# Patient Record
Sex: Female | Born: 1955 | Race: White | Hispanic: No | Marital: Married | Smoking: Former smoker
Health system: Southern US, Community
[De-identification: ages and names within clinical notes are randomized; demographics above are authoritative.]

## PROBLEM LIST (undated history)

## (undated) DIAGNOSIS — I10 Essential (primary) hypertension: Secondary | ICD-10-CM

## (undated) HISTORY — DX: Essential (primary) hypertension: I10

## (undated) HISTORY — PX: DILATION AND CURETTAGE OF UTERUS: SHX78

---

## 1978-12-15 HISTORY — PX: BREAST BIOPSY: SHX20

## 1978-12-15 HISTORY — PX: LAPAROSCOPY ABDOMEN DIAGNOSTIC: PRO50

## 1988-04-15 HISTORY — PX: KNEE ARTHROSCOPY: SUR90

## 1999-05-21 ENCOUNTER — Other Ambulatory Visit: Admission: RE | Admit: 1999-05-21 | Discharge: 1999-05-21 | Payer: Self-pay | Admitting: Gynecology

## 1999-05-25 ENCOUNTER — Encounter (INDEPENDENT_AMBULATORY_CARE_PROVIDER_SITE_OTHER): Payer: Self-pay

## 1999-05-25 ENCOUNTER — Other Ambulatory Visit: Admission: RE | Admit: 1999-05-25 | Discharge: 1999-05-25 | Payer: Self-pay | Admitting: Gynecology

## 2000-06-11 ENCOUNTER — Other Ambulatory Visit: Admission: RE | Admit: 2000-06-11 | Discharge: 2000-06-11 | Payer: Self-pay | Admitting: Gynecology

## 2000-06-26 ENCOUNTER — Encounter (INDEPENDENT_AMBULATORY_CARE_PROVIDER_SITE_OTHER): Payer: Self-pay

## 2000-06-26 ENCOUNTER — Other Ambulatory Visit: Admission: RE | Admit: 2000-06-26 | Discharge: 2000-06-26 | Payer: Self-pay | Admitting: Gynecology

## 2001-05-20 ENCOUNTER — Other Ambulatory Visit: Admission: RE | Admit: 2001-05-20 | Discharge: 2001-05-20 | Payer: Self-pay | Admitting: Gynecology

## 2002-06-14 ENCOUNTER — Other Ambulatory Visit: Admission: RE | Admit: 2002-06-14 | Discharge: 2002-06-14 | Payer: Self-pay | Admitting: Gynecology

## 2003-06-07 ENCOUNTER — Encounter: Admission: RE | Admit: 2003-06-07 | Discharge: 2003-06-07 | Payer: Self-pay | Admitting: Gynecology

## 2003-06-13 ENCOUNTER — Encounter: Admission: RE | Admit: 2003-06-13 | Discharge: 2003-06-13 | Payer: Self-pay | Admitting: Gynecology

## 2003-07-21 ENCOUNTER — Other Ambulatory Visit: Admission: RE | Admit: 2003-07-21 | Discharge: 2003-07-21 | Payer: Self-pay | Admitting: Gynecology

## 2004-06-25 ENCOUNTER — Encounter: Admission: RE | Admit: 2004-06-25 | Discharge: 2004-06-25 | Payer: Self-pay | Admitting: Gynecology

## 2004-07-02 ENCOUNTER — Encounter: Admission: RE | Admit: 2004-07-02 | Discharge: 2004-07-02 | Payer: Self-pay | Admitting: Gynecology

## 2004-08-09 ENCOUNTER — Other Ambulatory Visit: Admission: RE | Admit: 2004-08-09 | Discharge: 2004-08-09 | Payer: Self-pay | Admitting: Gynecology

## 2004-12-27 ENCOUNTER — Encounter: Admission: RE | Admit: 2004-12-27 | Discharge: 2004-12-27 | Payer: Self-pay | Admitting: Gynecology

## 2005-08-15 ENCOUNTER — Other Ambulatory Visit: Admission: RE | Admit: 2005-08-15 | Discharge: 2005-08-15 | Payer: Self-pay | Admitting: Gynecology

## 2006-01-01 ENCOUNTER — Encounter: Admission: RE | Admit: 2006-01-01 | Discharge: 2006-01-01 | Payer: Self-pay | Admitting: Gynecology

## 2006-09-18 ENCOUNTER — Other Ambulatory Visit: Admission: RE | Admit: 2006-09-18 | Discharge: 2006-09-18 | Payer: Self-pay | Admitting: Gynecology

## 2007-01-06 ENCOUNTER — Encounter: Admission: RE | Admit: 2007-01-06 | Discharge: 2007-01-06 | Payer: Self-pay | Admitting: Gynecology

## 2007-05-15 ENCOUNTER — Encounter: Admission: RE | Admit: 2007-05-15 | Discharge: 2007-05-15 | Payer: Self-pay

## 2007-11-03 ENCOUNTER — Other Ambulatory Visit: Admission: RE | Admit: 2007-11-03 | Discharge: 2007-11-03 | Payer: Self-pay | Admitting: Gynecology

## 2008-05-19 ENCOUNTER — Encounter: Admission: RE | Admit: 2008-05-19 | Discharge: 2008-05-19 | Payer: Self-pay | Admitting: Gynecology

## 2010-02-21 ENCOUNTER — Ambulatory Visit: Payer: Self-pay | Admitting: Vascular Surgery

## 2010-08-28 NOTE — Procedures (Signed)
DUPLEX ULTRASOUND OF ABDOMINAL AORTA   INDICATION:  Family history of AAA.   HISTORY:  Diabetes:  No.  Cardiac:  No.  Hypertension:  No.  Smoking:  No.  Connective Tissue Disorder:  Family History:  Mother, father, brother have AAA.  Previous Surgery:  No.   DUPLEX EXAM:         AP (cm)                   TRANSVERSE (cm)  Proximal             1.24 cm                   1.18 cm  Mid                  1.18 cm                   1.24 cm  Distal               1.19 cm                   1.16 cm  Right Iliac  Left Iliac   PREVIOUS:  Date:  AP  TRANSVERSE:   IMPRESSION:  No evidence of abdominal aortic aneurysm was found.   ___________________________________________  Di Kindle. Edilia Bo, M.D.   EM/MEDQ  D:  02/21/2010  T:  02/21/2010  Job:  [id number]

## 2010-08-31 ENCOUNTER — Other Ambulatory Visit: Payer: Self-pay | Admitting: Family Medicine

## 2010-08-31 DIAGNOSIS — M79673 Pain in unspecified foot: Secondary | ICD-10-CM

## 2010-08-31 DIAGNOSIS — M79606 Pain in leg, unspecified: Secondary | ICD-10-CM

## 2010-09-06 ENCOUNTER — Other Ambulatory Visit: Payer: Self-pay | Admitting: Family Medicine

## 2010-09-06 ENCOUNTER — Ambulatory Visit
Admission: RE | Admit: 2010-09-06 | Discharge: 2010-09-06 | Disposition: A | Discharge status: Home / Self Care | Payer: BC Managed Care – PPO | Source: Ambulatory Visit | Attending: Family Medicine | Admitting: Family Medicine

## 2010-09-06 DIAGNOSIS — M79673 Pain in unspecified foot: Secondary | ICD-10-CM

## 2010-09-06 DIAGNOSIS — Z8249 Family history of ischemic heart disease and other diseases of the circulatory system: Secondary | ICD-10-CM

## 2010-09-06 DIAGNOSIS — M79606 Pain in leg, unspecified: Secondary | ICD-10-CM

## 2011-07-30 ENCOUNTER — Encounter: Payer: Self-pay | Admitting: Internal Medicine

## 2011-08-13 ENCOUNTER — Ambulatory Visit (AMBULATORY_SURGERY_CENTER): Payer: BC Managed Care – PPO | Admitting: *Deleted

## 2011-08-13 VITALS — Ht 70.5 in | Wt 158.0 lb

## 2011-08-13 DIAGNOSIS — Z1211 Encounter for screening for malignant neoplasm of colon: Secondary | ICD-10-CM

## 2011-08-13 MED ORDER — PEG-KCL-NACL-NASULF-NA ASC-C 100 G PO SOLR: ORAL | Status: DC

## 2011-08-26 ENCOUNTER — Telehealth: Payer: Self-pay | Admitting: Internal Medicine

## 2011-08-26 NOTE — Telephone Encounter (Signed)
Pt. Had MOVI Prep instead of Suprep and needed instructions on how to take the Movi prep.  Apology given for mixup.

## 2011-08-27 ENCOUNTER — Encounter: Payer: Self-pay | Admitting: Internal Medicine

## 2011-08-27 ENCOUNTER — Ambulatory Visit (AMBULATORY_SURGERY_CENTER): Payer: BC Managed Care – PPO | Admitting: Internal Medicine

## 2011-08-27 VITALS — BP 127/75 | HR 77 | Temp 96.5°F | Resp 16 | Ht 70.5 in | Wt 158.0 lb

## 2011-08-27 DIAGNOSIS — D126 Benign neoplasm of colon, unspecified: Secondary | ICD-10-CM

## 2011-08-27 DIAGNOSIS — Z1211 Encounter for screening for malignant neoplasm of colon: Secondary | ICD-10-CM

## 2011-08-27 DIAGNOSIS — K635 Polyp of colon: Secondary | ICD-10-CM

## 2011-08-27 MED ORDER — SODIUM CHLORIDE 0.9 % IV SOLN: 500.0000 mL | INTRAVENOUS | Status: DC

## 2011-08-27 NOTE — Progress Notes (Signed)
See phone note for 08-26-11- pt had Moviprep instead of Suprep

## 2011-08-27 NOTE — Op Note (Signed)
[facility] [address] [place], [place]  [address]  COLONOSCOPY PROCEDURE REPORT  Ferguson:  Whitney Ferguson, Whitney Ferguson  MR#:  [id number] BIRTHDATE:  1955-07-13, 55 yrs. old  GENDER:  female ENDOSCOPIST:  Carie Caddy. Bryana Froemming, MD REF. BY:  Burnell Blanks, M.D. PROCEDURE DATE:  08/27/2011 PROCEDURE:  Colonoscopy with snare polypectomy ASA CLASS:  Class II INDICATIONS:  Routine Risk Screening, 1st colonoscopy MEDICATIONS:   MAC sedation, administered by CRNA, propofol (Diprivan) 320 mg IV  DESCRIPTION OF PROCEDURE:   After Whitney risks benefits and alternatives of Whitney procedure were thoroughly explained, informed consent was obtained.  Digital rectal exam was performed and revealed moderate external hemorrhoids.   Whitney LB PCF-H180AL [id number] endoscope was introduced through Whitney anus and advanced to Whitney cecum, which was identified by both Whitney appendix and ileocecal valve, without limitations.  Whitney quality of Whitney prep was good, using MoviPrep.  Whitney instrument was then slowly withdrawn as Whitney colon was fully examined. <<PROCEDUREIMAGES>>  FINDINGS:  Mild diverticulosis was found in Whitney right colon.  A 6 mm sessile polyp was found in Whitney cecum. Polyp was snared, then cauterized with monopolar cautery. Retrieval was successful. Retroflexed views in Whitney rectum revealed no abnormalities. Whitney scope was then withdrawn from Whitney cecum and Whitney procedure completed.  COMPLICATIONS:  None ENDOSCOPIC IMPRESSION: 1) Mild diverticulosis in Whitney right colon 2) Sessile polyp in Whitney cecum. Removed and sent to pathology. 3) External hemorrhoids  RECOMMENDATIONS: 1) Hold aspirin, aspirin products, and anti-inflammatory medication for 1 week. 2) Await pathology results 3) High fiber diet. 4) If Whitney polyp removed today is proven to be an adenomatous (pre-cancerous) polyp, you will need a repeat colonoscopy in 5 years. Otherwise you should continue to follow colorectal cancer screening guidelines for "routine  risk" patients with colonoscopy in 10 years. You will receive a letter within 1-2 weeks with Whitney results of your biopsy as well as final recommendations. Please call my office if you have not received a letter after 3 weeks. 5) If external hemorrhoids are a problem, please contact our office and treatment can be prescribed.  Carie Caddy. Rhea Belton, MD  CC:  Whitney Ferguson Burnell Blanks, MD  n. eSIGNEDCarie Caddy. Zadin Lange at 08/27/2011 10:49 AM  Audery Amel, [phone]

## 2011-08-27 NOTE — Patient Instructions (Addendum)
See colonoscopy report for findings and Recommendations  HOLD ASPIRIN, ASPIRIN PRODUCTS AND ANTI-INFLAMMATORY MEDICATION FOR ONE WEEK.  CAN RESUME ASPIRIN PRODUCTS ON 09-03-2011.  YOU HAD AN ENDOSCOPIC PROCEDURE TODAY AT THE [facility]: Refer to the procedure report that was given to you for any specific questions about what was found during the examination.  If the procedure report does not answer your questions, please call your gastroenterologist to clarify.  If you requested that your care partner not be given the details of your procedure findings, then the procedure report has been included in a sealed envelope for you to review at your convenience later.  YOU SHOULD EXPECT: Some feelings of bloating in the abdomen. Passage of more gas than usual.  Walking can help get rid of the air that was put into your GI tract during the procedure and reduce the bloating. If you had a lower endoscopy (such as a colonoscopy or flexible sigmoidoscopy) you may notice spotting of blood in your stool or on the toilet paper. If you underwent a bowel prep for your procedure, then you may not have a normal bowel movement for a few days.  DIET: Your first meal following the procedure should be a light meal and then it is ok to progress to your normal diet.  A half-sandwich or bowl of soup is an example of a good first meal.  Heavy or fried foods are harder to digest and may make you feel nauseous or bloated.  Likewise meals heavy in dairy and vegetables can cause extra gas to form and this can also increase the bloating.  Drink plenty of fluids but you should avoid alcoholic beverages for 24 hours.  ACTIVITY: Your care partner should take you home directly after the procedure.  You should plan to take it easy, moving slowly for the rest of the day.  You can resume normal activity the day after the procedure however you should NOT DRIVE or use heavy machinery for 24 hours (because of the sedation medicines  used during the test).    SYMPTOMS TO REPORT IMMEDIATELY: A gastroenterologist can be reached at any hour.  During normal business hours, 8:30 AM to 5:00 PM Monday through Friday, call [phone].  After hours and on weekends, please call the GI answering service at [phone] who will take a message and have the physician on call contact you.   Following lower endoscopy (colonoscopy or flexible sigmoidoscopy):  Excessive amounts of blood in the stool  Significant tenderness or worsening of abdominal pains  Swelling of the abdomen that is new, acute  Fever of 100F or higher  Following upper endoscopy (EGD)  Vomiting of blood or coffee ground material  New chest pain or pain under the shoulder blades  Painful or persistently difficult swallowing  New shortness of breath  Fever of 100F or higher  Black, tarry-looking stools  FOLLOW UP: If any biopsies were taken you will be contacted by phone or by letter within the next 1-3 weeks.  Call your gastroenterologist if you have not heard about the biopsies in 3 weeks.  Our staff will call the home number listed on your records the next business day following your procedure to check on you and address any questions or concerns that you may have at that time regarding the information given to you following your procedure. This is a courtesy call and so if there is no answer at the home number and we have not heard from you  through the emergency physician on call, we will assume that you have returned to your regular daily activities without incident.  SIGNATURES/CONFIDENTIALITY: You and/or your care partner have signed paperwork which will be entered into your electronic medical record.  These signatures attest to the fact that that the information above on your After Visit Summary has been reviewed and is understood.  Full responsibility of the confidentiality of this discharge information lies with you and/or your care-partner.   Please  follow all discharge instructions given to you by the recovery room nurse. If you have any questions or problems after discharge please call one of the numbers listed above. You will receive a phone call in the am to see how you are doing and answer any questions you may have. Thank you for choosing [facility] for your health care needs.

## 2011-08-27 NOTE — Progress Notes (Signed)
Patient did not experience any of the following events: a burn prior to discharge; a fall within the facility; wrong site/side/patient/procedure/implant event; or a hospital transfer or hospital admission upon discharge from the facility. (G8907) Patient did not have preoperative order for IV antibiotic SSI prophylaxis. (G8918)  

## 2011-08-28 ENCOUNTER — Telehealth: Payer: Self-pay | Admitting: *Deleted

## 2011-08-28 NOTE — Telephone Encounter (Signed)
  Follow up Call-  Call back number 08/27/2011  Post procedure Call Back phone  # [phone]  Permission to leave phone message Yes     Patient questions:  Do you have a fever, pain , or abdominal swelling? no Pain Score  0 *  Have you tolerated food without any problems? yes  Have you been able to return to your normal activities? yes  Do you have any questions about your discharge instructions: Diet   no Medications  no Follow up visit  no  Do you have questions or concerns about your Care? no  Actions: * If pain score is 4 or above: No action needed, pain <4.

## 2011-09-04 ENCOUNTER — Encounter: Payer: Self-pay | Admitting: Internal Medicine

## 2014-03-01 ENCOUNTER — Ambulatory Visit (INDEPENDENT_AMBULATORY_CARE_PROVIDER_SITE_OTHER): Payer: BC Managed Care – PPO | Admitting: Physician Assistant

## 2014-03-01 VITALS — BP 120/62 | HR 105 | Temp 98.9°F | Resp 16 | Ht 67.75 in | Wt 161.2 lb

## 2014-03-01 DIAGNOSIS — M545 Low back pain, unspecified: Secondary | ICD-10-CM

## 2014-03-01 DIAGNOSIS — R6889 Other general symptoms and signs: Secondary | ICD-10-CM

## 2014-03-01 DIAGNOSIS — E785 Hyperlipidemia, unspecified: Secondary | ICD-10-CM

## 2014-03-01 LAB — POCT URINALYSIS DIPSTICK
BILIRUBIN UA: NEGATIVE
Glucose, UA: NEGATIVE
Ketones, UA: 40
LEUKOCYTES UA: NEGATIVE
NITRITE UA: NEGATIVE
Protein, UA: NEGATIVE
Spec Grav, UA: 1.015
UROBILINOGEN UA: 0.2
pH, UA: 6

## 2014-03-01 LAB — POCT CBC
GRANULOCYTE PERCENT: 80.3 % — AB (ref 37–80)
HEMATOCRIT: 36.4 % — AB (ref 37.7–47.9)
Hemoglobin: 12.1 g/dL — AB (ref 12.2–16.2)
Lymph, poc: 0.7 (ref 0.6–3.4)
MCH: 28.5 pg (ref 27–31.2)
MCHC: 33.2 g/dL (ref 31.8–35.4)
MCV: 85.9 fL (ref 80–97)
MID (cbc): 0.4 (ref 0–0.9)
MPV: 7.4 fL (ref 0–99.8)
POC Granulocyte: 4.2 (ref 2–6.9)
POC LYMPH %: 12.6 % (ref 10–50)
POC MID %: 7.1 %M (ref 0–12)
Platelet Count, POC: 125 10*3/uL — AB (ref 142–424)
RBC: 4.24 M/uL (ref 4.04–5.48)
RDW, POC: 12.6 %
WBC: 5.2 10*3/uL (ref 4.6–10.2)

## 2014-03-01 LAB — POCT UA - MICROSCOPIC ONLY
Bacteria, U Microscopic: NEGATIVE
CRYSTALS, UR, HPF, POC: NEGATIVE
Casts, Ur, LPF, POC: NEGATIVE
Mucus, UA: NEGATIVE
Yeast, UA: NEGATIVE

## 2014-03-01 LAB — POCT INFLUENZA A/B
INFLUENZA A, POC: NEGATIVE
INFLUENZA B, POC: NEGATIVE

## 2014-03-01 NOTE — Progress Notes (Signed)
Subjective:    Patient ID: Whitney Ferguson, female    DOB: 13-Jan-1956, 58 y.o.   MRN: [id number]  HPI Pt presents to clinic with <24h h/o myalgias and bilateral lower back pain with fever this afternoon and chills.  She has had a cough for the last week but it is dry and she has no SOB or wheezing.  She is a former smoker without asthma.  She is having no urinary symptoms.  She has not had a flu vaccine this year.  She works at [employer] and this started last night when she was at work and she thought that she night have worked to hard but then she started to get other symptoms that she does not normally get when she overworks.  Her daughter was sick last week with similar symptoms.    04/2012 - saw urologist for hematuria she states he said she has nothing to worry about  Review of Systems  Constitutional: Positive for fever (101.3 - this afternoon) and chills.  HENT: Negative for congestion and rhinorrhea.   Respiratory: Positive for cough.   Gastrointestinal: Positive for nausea and abdominal pain (crampy). Negative for vomiting and diarrhea.  Musculoskeletal: Positive for myalgias and back pain.  Neurological: Positive for headaches.       Objective:   Physical Exam  Constitutional: She is oriented to person, place, and time. She appears well-developed and well-nourished. She appears ill.  BP 120/62 mmHg  Pulse 105  Temp(Src) 98.9 F (37.2 C) (Oral)  Resp 16  Ht 5' 7.75" (1.721 m)  Wt 161 lb 4 oz (73.143 kg)  BMI 24.70 kg/m2  SpO2 96%   HENT:  Head: Normocephalic and atraumatic.  Right Ear: External ear normal.  Left Ear: External ear normal.  Eyes: Conjunctivae are normal.  Neck: Normal range of motion.  Cardiovascular: Normal rate, regular rhythm and normal heart sounds.   No murmur heard. Pulmonary/Chest: Effort normal and breath sounds normal. She has no wheezes.  Abdominal: Soft. There is tenderness (generalized). There is no CVA tenderness.  Musculoskeletal:   Lumbar back: She exhibits tenderness (slightly sore to palpation over paralumbar muscles bilaterally) and spasm (mild). She exhibits normal range of motion, no bony tenderness and no pain (neg SLR).  Lymphadenopathy:    She has no cervical adenopathy.  Neurological: She is alert and oriented to person, place, and time. She has normal strength and normal reflexes. No sensory deficit.  Skin: Skin is warm and dry.  Psychiatric: She has a normal mood and affect. Her behavior is normal. Judgment and thought content normal.   Results for orders placed or performed in visit on 03/01/14  POCT urinalysis dipstick  Result Value Ref Range   Color, UA yellow    Clarity, UA clear    Glucose, UA neg    Bilirubin, UA neg    Ketones, UA 40    Spec Grav, UA 1.015    Blood, UA large    pH, UA 6.0    Protein, UA neg    Urobilinogen, UA 0.2    Nitrite, UA neg    Leukocytes, UA Negative   POCT UA - Microscopic Only  Result Value Ref Range   WBC, Ur, HPF, POC 0-2    RBC, urine, microscopic 10-12    Bacteria, U Microscopic neg    Mucus, UA neg    Epithelial cells, urine per micros 0-1    Crystals, Ur, HPF, POC neg    Casts, Ur, LPF, POC neg  Yeast, UA neg   POCT Influenza A/B  Result Value Ref Range   Influenza A, POC Negative    Influenza B, POC Negative   POCT CBC  Result Value Ref Range   WBC 5.2 4.6 - 10.2 K/uL   Lymph, poc 0.7 0.6 - 3.4   POC LYMPH PERCENT 12.6 10 - 50 %L   MID (cbc) 0.4 0 - 0.9   POC MID % 7.1 0 - 12 %M   POC Granulocyte 4.2 2 - 6.9   Granulocyte percent 80.3 (A) 37 - 80 %G   RBC 4.24 4.04 - 5.48 M/uL   Hemoglobin 12.1 (A) 12.2 - 16.2 g/dL   HCT, POC 36.4 (A) 37.7 - 47.9 %   MCV 85.9 80 - 97 fL   MCH, POC 28.5 27 - 31.2 pg   MCHC 33.2 31.8 - 35.4 g/dL   RDW, POC 12.6 %   Platelet Count, POC 125 (A) 142 - 424 K/uL   MPV 7.4 0 - 99.8 fL        Assessment & Plan:  Bilateral low back pain without sciatica - Plan: POCT urinalysis dipstick, POCT UA - Microscopic  Only  Hyperlipidemia  Flu-like symptoms - Plan: POCT Influenza A/B, POCT CBC   We will treat the patient symptomatic for viral uri and d/w pt she may start to get more URI symptoms.  We will treat her myalgias with OTC medications and give her 2 days to rest.  Her questions are answered to her satisfaction.  Windell Hummingbird PA-C  [facility] 03/01/2014 8:14 PM

## 2014-03-01 NOTE — Patient Instructions (Signed)
Ibuprofen up to 800 mg 3x.day (that is 4 pills of your over the counter medications)  Increase your fluids.  If your get congestion you can take Mucinex for it.

## 2014-05-11 ENCOUNTER — Other Ambulatory Visit: Payer: Self-pay | Admitting: Gynecology

## 2014-05-12 LAB — CYTOLOGY - PAP

## 2017-11-03 ENCOUNTER — Other Ambulatory Visit: Payer: Self-pay | Admitting: Obstetrics and Gynecology

## 2017-11-03 DIAGNOSIS — R928 Other abnormal and inconclusive findings on diagnostic imaging of breast: Secondary | ICD-10-CM

## 2017-11-07 ENCOUNTER — Other Ambulatory Visit: Payer: Self-pay | Admitting: Obstetrics and Gynecology

## 2017-11-07 ENCOUNTER — Ambulatory Visit
Admission: RE | Admit: 2017-11-07 | Discharge: 2017-11-07 | Disposition: A | Discharge status: Home / Self Care | Payer: BLUE CROSS/BLUE SHIELD | Source: Ambulatory Visit | Attending: Obstetrics and Gynecology | Admitting: Obstetrics and Gynecology

## 2017-11-07 DIAGNOSIS — R928 Other abnormal and inconclusive findings on diagnostic imaging of breast: Secondary | ICD-10-CM

## 2017-11-07 DIAGNOSIS — R921 Mammographic calcification found on diagnostic imaging of breast: Secondary | ICD-10-CM

## 2018-05-13 ENCOUNTER — Ambulatory Visit
Admission: RE | Admit: 2018-05-13 | Discharge: 2018-05-13 | Disposition: A | Discharge status: Home / Self Care | Payer: BLUE CROSS/BLUE SHIELD | Source: Ambulatory Visit | Attending: Obstetrics and Gynecology | Admitting: Obstetrics and Gynecology

## 2018-05-13 DIAGNOSIS — R921 Mammographic calcification found on diagnostic imaging of breast: Secondary | ICD-10-CM

## 2019-02-24 ENCOUNTER — Ambulatory Visit (INDEPENDENT_AMBULATORY_CARE_PROVIDER_SITE_OTHER): Payer: BC Managed Care – PPO

## 2019-02-24 ENCOUNTER — Other Ambulatory Visit: Payer: Self-pay

## 2019-02-24 ENCOUNTER — Ambulatory Visit: Payer: BC Managed Care – PPO | Admitting: Podiatry

## 2019-02-24 ENCOUNTER — Encounter: Payer: Self-pay | Admitting: Podiatry

## 2019-02-24 VITALS — BP 138/76 | HR 84 | Resp 16

## 2019-02-24 DIAGNOSIS — M722 Plantar fascial fibromatosis: Secondary | ICD-10-CM

## 2019-02-24 DIAGNOSIS — M25472 Effusion, left ankle: Secondary | ICD-10-CM

## 2019-02-24 DIAGNOSIS — M659 Synovitis and tenosynovitis, unspecified: Secondary | ICD-10-CM

## 2019-02-24 DIAGNOSIS — M65979 Unspecified synovitis and tenosynovitis, unspecified ankle and foot: Secondary | ICD-10-CM

## 2019-02-24 MED ORDER — MELOXICAM 15 MG PO TABS: 15.0000 mg | ORAL_TABLET | Freq: Every day | ORAL | 1 refills | Status: DC

## 2019-02-24 MED ORDER — METHYLPREDNISOLONE 4 MG PO TBPK: ORAL_TABLET | ORAL | 0 refills | Status: AC

## 2019-02-28 NOTE — Progress Notes (Signed)
   Subjective: 63 y.o. female presenting today as a new patient with a chief complaint of aching pain to the plantar right heel that began one week ago. She states the pain is worse in the morning. Walking increases the pain. She also reports swelling of the left ankle. She has not had any treatment for her symptoms. She reports h/o plantar fasciitis over ten years ago. Patient is here for further evaluation and treatment.   Past Medical History:  Diagnosis Date  . Hypertension      Objective: Physical Exam General: The patient is alert and oriented x3 in no acute distress.  Dermatology: Skin is warm, dry and supple bilateral lower extremities. Negative for open lesions or macerations bilateral.   Vascular: Dorsalis Pedis and Posterior Tibial pulses palpable bilateral.  Capillary fill time is immediate to all digits.  Neurological: Epicritic and protective threshold intact bilateral.   Musculoskeletal: Tenderness to palpation to the plantar aspect of the right heel along the plantar fascia. Left ankle edema noted. All other joints range of motion within normal limits bilateral. Strength 5/5 in all groups bilateral.   Radiographic exam: Normal osseous mineralization. Joint spaces preserved. No fracture/dislocation/boney destruction. No other soft tissue abnormalities or radiopaque foreign bodies.   Assessment: 1. Plantar fasciitis right 2. Left ankle edema   Plan of Care:  1. Patient evaluated. Xrays reviewed.   2. Injection of 0.5cc Celestone soluspan injected into the right plantar fascia  3. Rx for Medrol Dose Pack placed 4. Rx for Meloxicam ordered for patient. 5. Plantar fascial band(s) dispensed 6. Instructed patient regarding therapies and modalities at home to alleviate symptoms.  7. Compression anklet dispensed for left ankle.  8. Return to clinic in 4 weeks.     Edrick Kins, DPM [facility]  Dr. Edrick Kins, DPM    [address], [place] [address]                Office [phone]  Fax [phone]

## 2019-03-22 ENCOUNTER — Ambulatory Visit (INDEPENDENT_AMBULATORY_CARE_PROVIDER_SITE_OTHER): Payer: BC Managed Care – PPO | Admitting: Podiatry

## 2019-03-22 ENCOUNTER — Other Ambulatory Visit: Payer: Self-pay

## 2019-03-22 DIAGNOSIS — M722 Plantar fascial fibromatosis: Secondary | ICD-10-CM

## 2019-03-22 DIAGNOSIS — M25472 Effusion, left ankle: Secondary | ICD-10-CM

## 2019-03-22 NOTE — Patient Instructions (Signed)

## 2019-03-24 NOTE — Progress Notes (Signed)
   Subjective: 63 y.o. female presenting today for follow up evaluation of plantar fasciitis of the right foot and left ankle edema. She states she is improving. Her left ankle edema has not resolved. She reports some continued pain in the right foot stating the injection helped alleviate the pain for about two weeks. She states the plantar fascial brace has not provided any relief. Being on the right foot makes the symptoms worse. She is still taking Meloxicam as directed. Patient is here for further evaluation and treatment.   Past Medical History:  Diagnosis Date  . Hypertension      Objective: Physical Exam General: The patient is alert and oriented x3 in no acute distress.  Dermatology: Skin is warm, dry and supple bilateral lower extremities. Negative for open lesions or macerations bilateral.   Vascular: Dorsalis Pedis and Posterior Tibial pulses palpable bilateral.  Capillary fill time is immediate to all digits.  Neurological: Epicritic and protective threshold intact bilateral.   Musculoskeletal: Tenderness to palpation to the plantar aspect of the right heel along the plantar fascia. All other joints range of motion within normal limits bilateral. Strength 5/5 in all groups bilateral.    Assessment: 1. Plantar fasciitis right - improved  2. Left ankle edema - resolved   Plan of Care:  1. Patient evaluated.  2. Injection of 0.5cc Celestone soluspan injected into the right plantar fascia  3. Continue taking Meloxicam.  4. Continue using plantar fascial brace on the right foot.  5. Continue using compression anklet on the left ankle.  6. Return to clinic in 6 weeks.   Works at [employer] in [employer].    Edrick Kins, DPM [facility]  Dr. Edrick Kins, DPM    [address], [place] [address]                Office [phone]  Fax [phone]

## 2019-04-19 ENCOUNTER — Other Ambulatory Visit: Payer: Self-pay | Admitting: Podiatry

## 2019-05-03 ENCOUNTER — Ambulatory Visit (INDEPENDENT_AMBULATORY_CARE_PROVIDER_SITE_OTHER): Payer: BC Managed Care – PPO | Admitting: Podiatry

## 2019-05-03 ENCOUNTER — Other Ambulatory Visit: Payer: Self-pay

## 2019-05-03 DIAGNOSIS — M722 Plantar fascial fibromatosis: Secondary | ICD-10-CM | POA: Diagnosis not present

## 2019-05-03 DIAGNOSIS — M659 Synovitis and tenosynovitis, unspecified: Secondary | ICD-10-CM

## 2019-05-03 DIAGNOSIS — M25472 Effusion, left ankle: Secondary | ICD-10-CM

## 2019-05-06 NOTE — Progress Notes (Signed)
   Subjective: 64 y.o. female presenting today for follow up evaluation of plantar fasciitis of the right foot and left ankle edema. She states she is doing well. She denies any current pain. She states the left ankle swelling had improved significantly until she returned to work. Being on the ankle for long periods of time causes some swelling to occur. She has been taking Meloxicam, using the compression anklet (left) and plantar fascial brace (right) as directed. Patient is here for further evaluation and treatment.   Past Medical History:  Diagnosis Date  . Hypertension      Objective: Physical Exam General: The patient is alert and oriented x3 in no acute distress.  Dermatology: Skin is cool, dry and supple bilateral lower extremities. Negative for open lesions or macerations.  Vascular: Palpable pedal pulses bilaterally. No edema or erythema noted. Capillary refill within normal limits.  Neurological: Epicritic and protective threshold grossly intact bilaterally.   Musculoskeletal Exam: All pedal and ankle joints range of motion within normal limits bilateral. Muscle strength 5/5 in all groups bilateral.    Assessment: 1. Plantar fasciitis right - resolved 2. Left ankle edema - resolved   Plan of Care:  1. Patient evaluated.  2. Continue taking Meloxicam as needed.  3. Recommended below knee compression socks.  4. Recommended good shoe gear.  5. Return to clinic as needed.   Works at [employer] in [employer].    Edrick Kins, DPM [facility]  Dr. Edrick Kins, DPM    [address], [place] [address]                Office [phone]  Fax [phone]

## 2020-08-02 IMAGING — MG DIGITAL DIAGNOSTIC UNILATERAL RIGHT MAMMOGRAM WITH TOMO AND CAD
6 series · 6 of 14 positions shown · non-contrast
Comparison: Previous exam(s).

CLINICAL DATA: Short-term follow-up for right breast
calcifications.Patient with no current breast complaints.

EXAM:
DIGITAL DIAGNOSTIC UNILATERAL RIGHT MAMMOGRAM WITH CAD AND TOMO

[R CC]
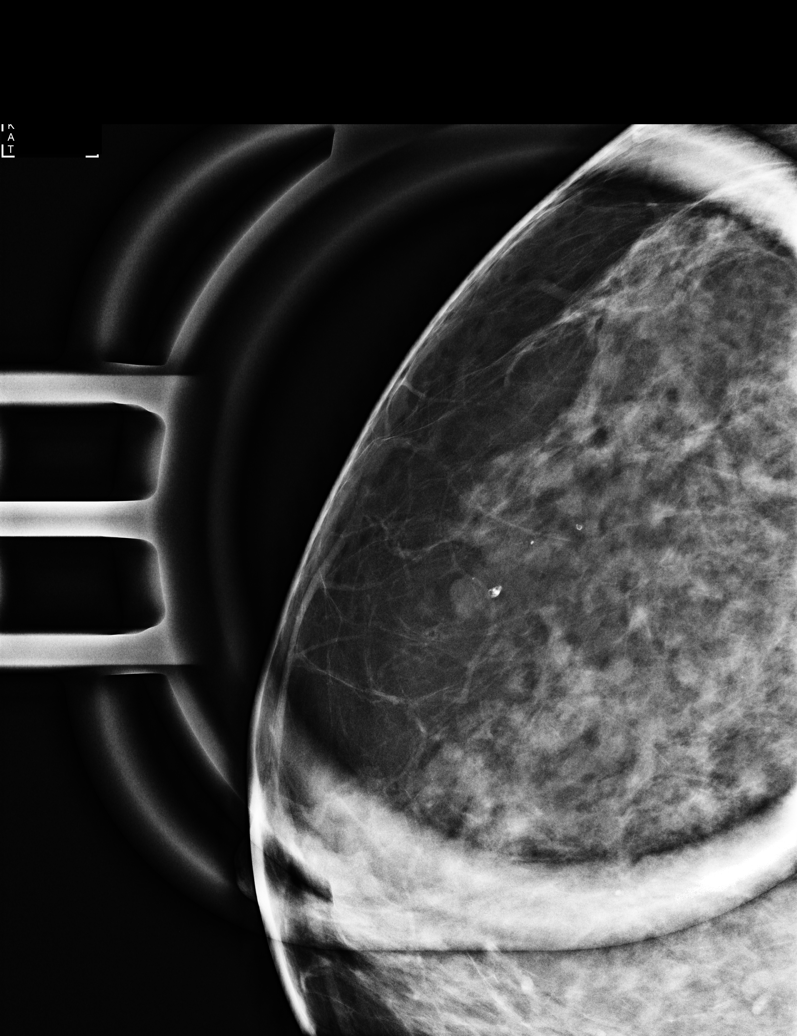

[R ML]
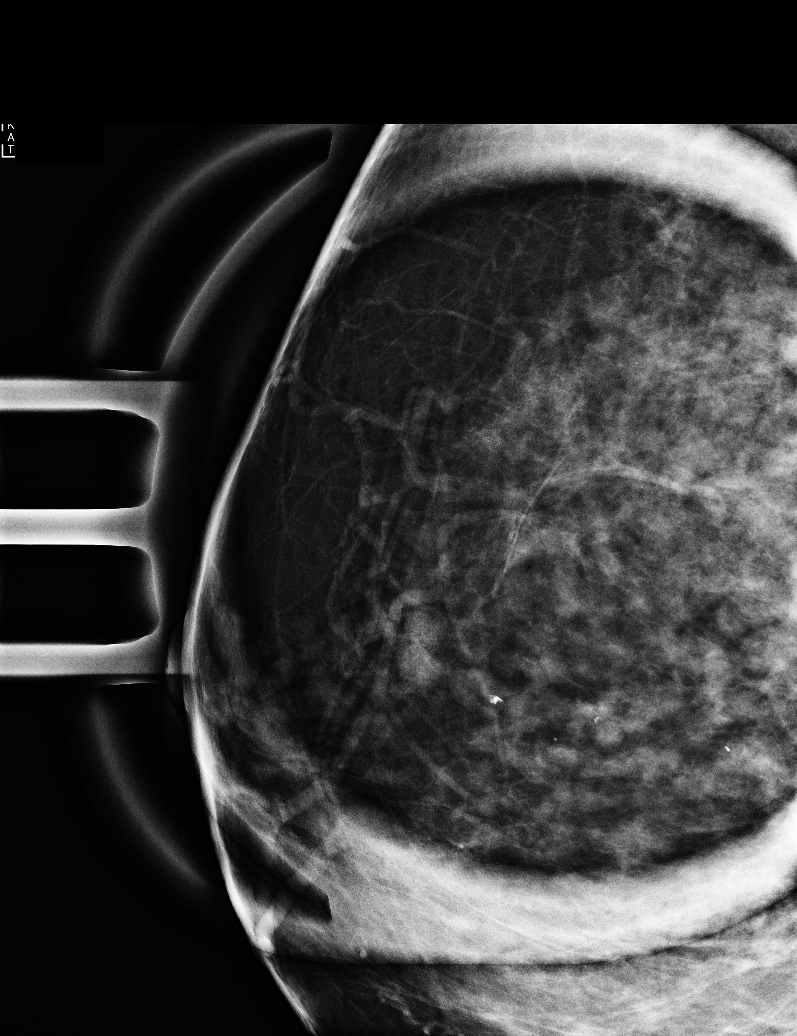

[R CC synth-2D]
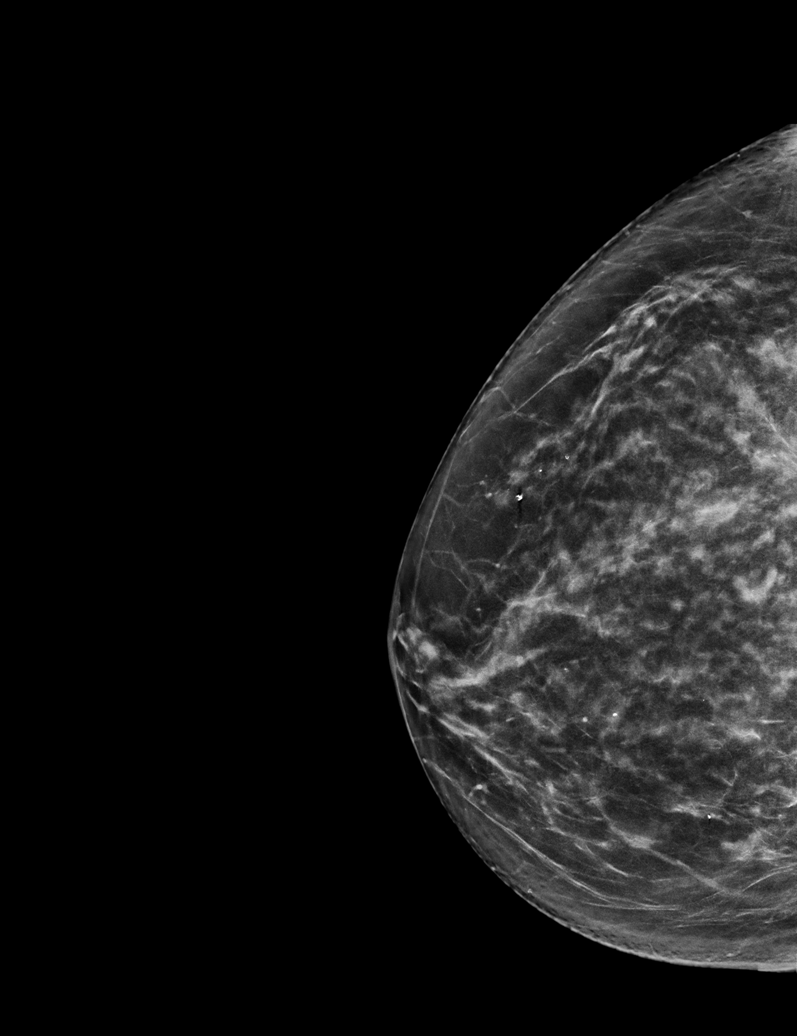

[R MLO synth-2D]
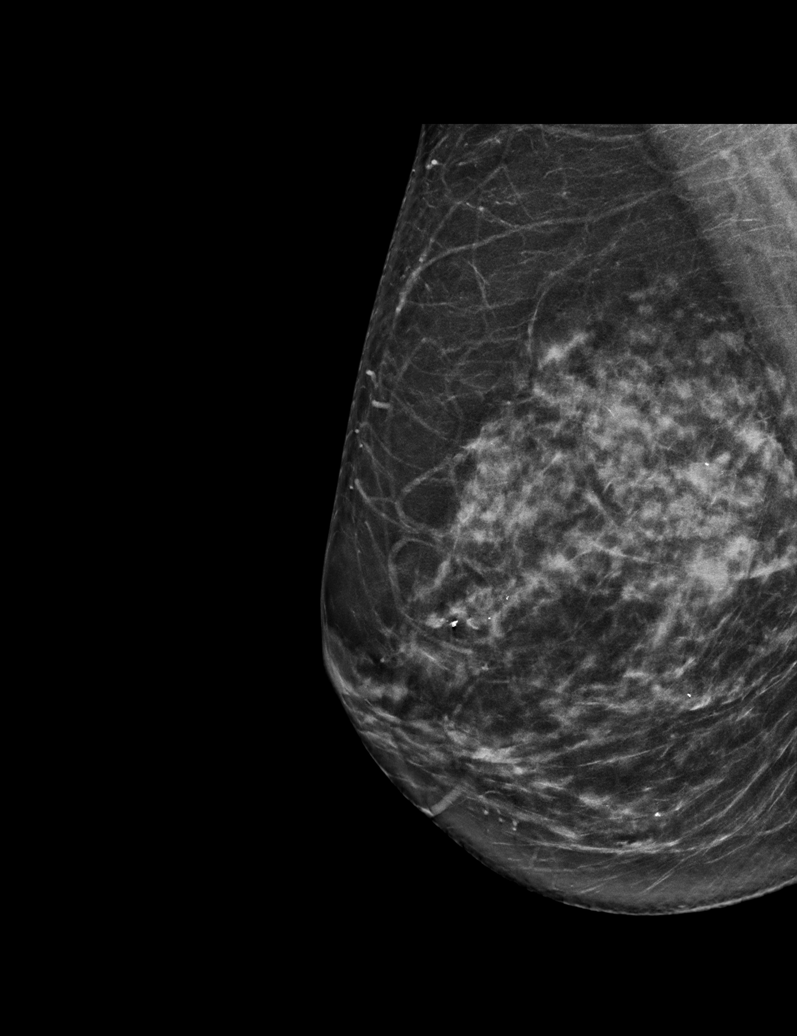

[R MLO tomo · tomo slice 35/69.0]
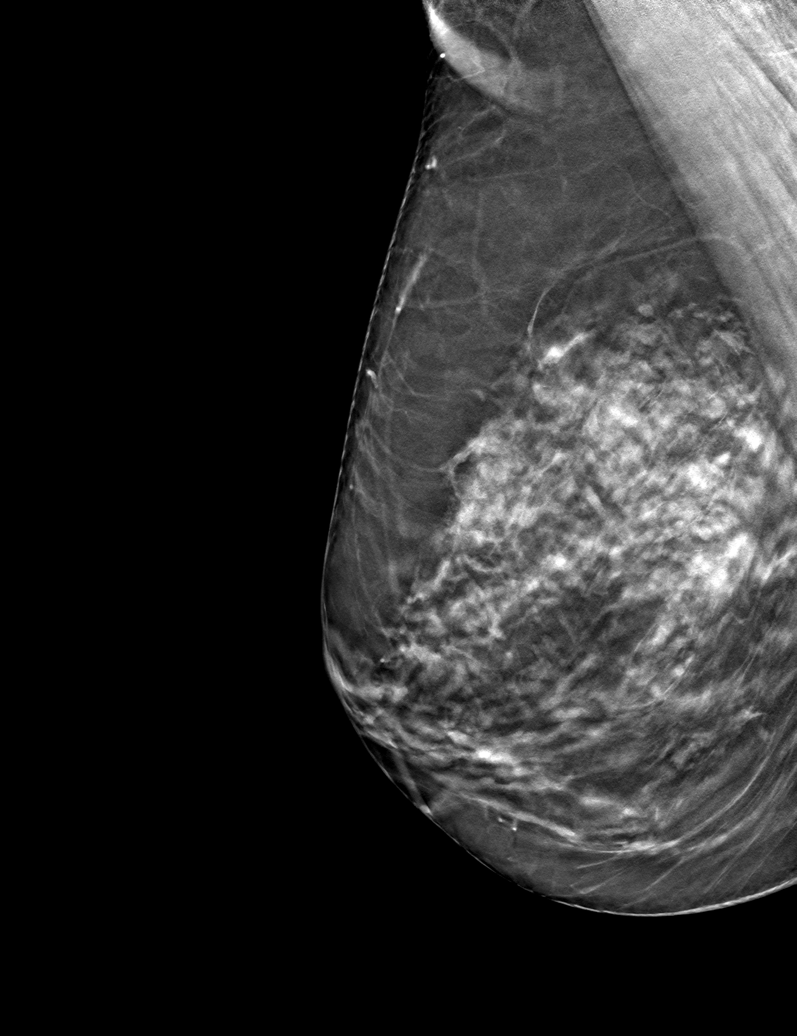

[R CC tomo · tomo slice 33/65.0]
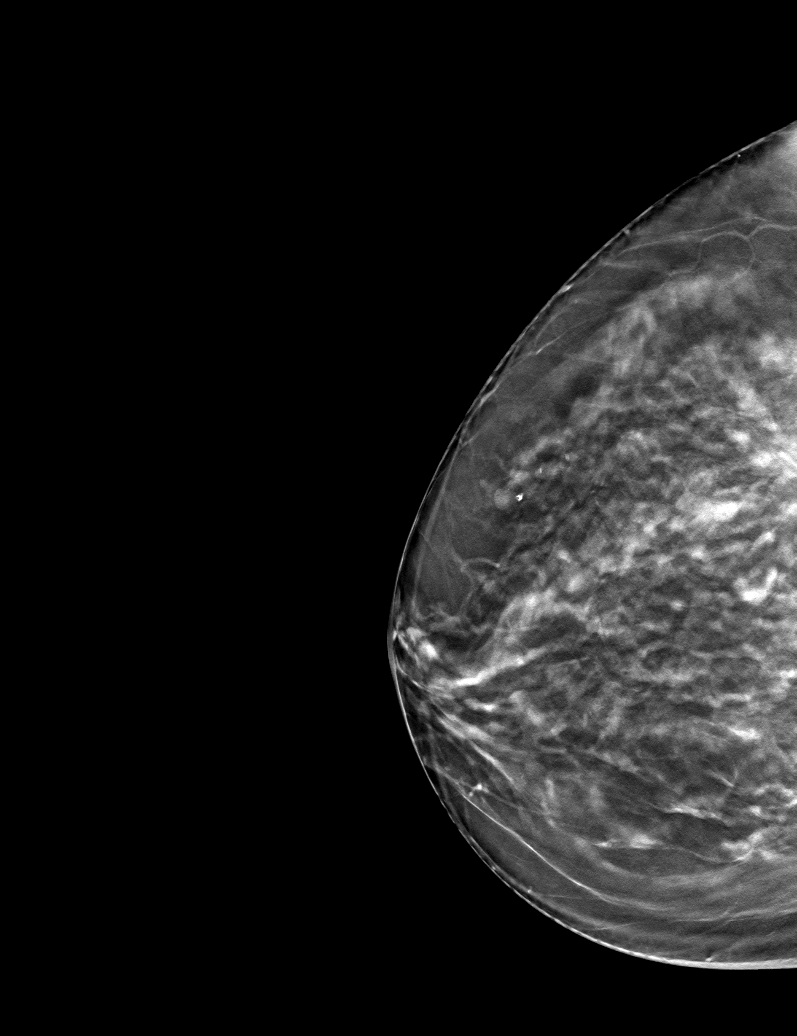

[6 of 14 positions shown; findings below may reference images not displayed]

ACR Breast Density Category c: The breast tissue is heterogeneously
dense, which may obscure small masses.
FINDINGS: Small loosely grouped calcifications the right breast are unchanged.
These have benign morphology, 1 with a lucent center. There is no
associated mass or distortion.

Mammographic images were processed with CAD.
IMPRESSION: 1. No evidence of breast malignancy.
2. Benign right breast calcifications.

RECOMMENDATION:
Screening mammogram in October 2018.(Code:[id number])

I have discussed the findings and recommendations with the patient.
Results were also provided in writing at the conclusion of the
visit. If applicable, a reminder letter will be sent to the patient
regarding the next appointment.

BI-RADS CATEGORY  2: Benign.

## 2021-02-26 ENCOUNTER — Other Ambulatory Visit: Payer: Self-pay | Admitting: Obstetrics and Gynecology

## 2021-02-26 DIAGNOSIS — R928 Other abnormal and inconclusive findings on diagnostic imaging of breast: Secondary | ICD-10-CM

## 2021-03-07 ENCOUNTER — Ambulatory Visit
Admission: RE | Admit: 2021-03-07 | Discharge: 2021-03-07 | Disposition: A | Discharge status: Home / Self Care | Payer: BC Managed Care – PPO | Source: Ambulatory Visit | Attending: Obstetrics and Gynecology | Admitting: Obstetrics and Gynecology

## 2021-03-07 DIAGNOSIS — R928 Other abnormal and inconclusive findings on diagnostic imaging of breast: Secondary | ICD-10-CM

## 2021-10-08 ENCOUNTER — Encounter: Payer: Self-pay | Admitting: Internal Medicine

## 2022-03-06 ENCOUNTER — Other Ambulatory Visit: Payer: Self-pay | Admitting: Family Medicine

## 2022-03-06 DIAGNOSIS — M7989 Other specified soft tissue disorders: Secondary | ICD-10-CM

## 2022-03-19 ENCOUNTER — Ambulatory Visit
Admission: RE | Admit: 2022-03-19 | Discharge: 2022-03-19 | Disposition: A | Discharge status: Home / Self Care | Payer: BC Managed Care – PPO | Source: Ambulatory Visit | Attending: Family Medicine | Admitting: Family Medicine

## 2022-03-19 DIAGNOSIS — M7989 Other specified soft tissue disorders: Secondary | ICD-10-CM

## 2023-03-19 DIAGNOSIS — Z1231 Encounter for screening mammogram for malignant neoplasm of breast: Secondary | ICD-10-CM | POA: Diagnosis not present

## 2023-03-19 DIAGNOSIS — M816 Localized osteoporosis [Lequesne]: Secondary | ICD-10-CM | POA: Diagnosis not present

## 2023-03-24 ENCOUNTER — Other Ambulatory Visit: Payer: Self-pay | Admitting: Obstetrics and Gynecology

## 2023-03-24 DIAGNOSIS — R928 Other abnormal and inconclusive findings on diagnostic imaging of breast: Secondary | ICD-10-CM

## 2023-04-03 ENCOUNTER — Ambulatory Visit
Admission: RE | Admit: 2023-04-03 | Discharge: 2023-04-03 | Disposition: A | Discharge status: Home / Self Care | Payer: Medicare Other | Source: Ambulatory Visit | Attending: Obstetrics and Gynecology | Admitting: Obstetrics and Gynecology

## 2023-04-03 DIAGNOSIS — R928 Other abnormal and inconclusive findings on diagnostic imaging of breast: Secondary | ICD-10-CM

## 2023-04-03 DIAGNOSIS — R92331 Mammographic heterogeneous density, right breast: Secondary | ICD-10-CM | POA: Diagnosis not present

## 2023-04-04 ENCOUNTER — Other Ambulatory Visit: Payer: Self-pay | Admitting: Obstetrics and Gynecology

## 2023-04-04 DIAGNOSIS — R921 Mammographic calcification found on diagnostic imaging of breast: Secondary | ICD-10-CM

## 2023-05-19 DIAGNOSIS — Z79899 Other long term (current) drug therapy: Secondary | ICD-10-CM | POA: Diagnosis not present

## 2023-05-19 DIAGNOSIS — E7800 Pure hypercholesterolemia, unspecified: Secondary | ICD-10-CM | POA: Diagnosis not present

## 2023-05-19 DIAGNOSIS — E038 Other specified hypothyroidism: Secondary | ICD-10-CM | POA: Diagnosis not present

## 2023-05-19 DIAGNOSIS — E559 Vitamin D deficiency, unspecified: Secondary | ICD-10-CM | POA: Diagnosis not present

## 2023-05-21 DIAGNOSIS — E7800 Pure hypercholesterolemia, unspecified: Secondary | ICD-10-CM | POA: Diagnosis not present

## 2023-05-21 DIAGNOSIS — E559 Vitamin D deficiency, unspecified: Secondary | ICD-10-CM | POA: Diagnosis not present

## 2023-05-21 DIAGNOSIS — Z9181 History of falling: Secondary | ICD-10-CM | POA: Diagnosis not present

## 2023-05-21 DIAGNOSIS — M818 Other osteoporosis without current pathological fracture: Secondary | ICD-10-CM | POA: Diagnosis not present

## 2023-05-21 DIAGNOSIS — E038 Other specified hypothyroidism: Secondary | ICD-10-CM | POA: Diagnosis not present

## 2023-05-21 DIAGNOSIS — M7989 Other specified soft tissue disorders: Secondary | ICD-10-CM | POA: Diagnosis not present

## 2023-05-21 DIAGNOSIS — R739 Hyperglycemia, unspecified: Secondary | ICD-10-CM | POA: Diagnosis not present

## 2023-05-28 IMAGING — US US BREAST*R* LIMITED INC AXILLA
1 series · 6 of 6 positions shown · non-contrast
Comparison: Previous exam(s).

CLINICAL DATA: The patient was called back for a right breast mass.

EXAM:
ULTRASOUND OF THE RIGHT BREAST

[Series 1: us breast*right* limited inc axilla · 0.06mm/px · 6 of 6 slices shown]
[im 1/6]
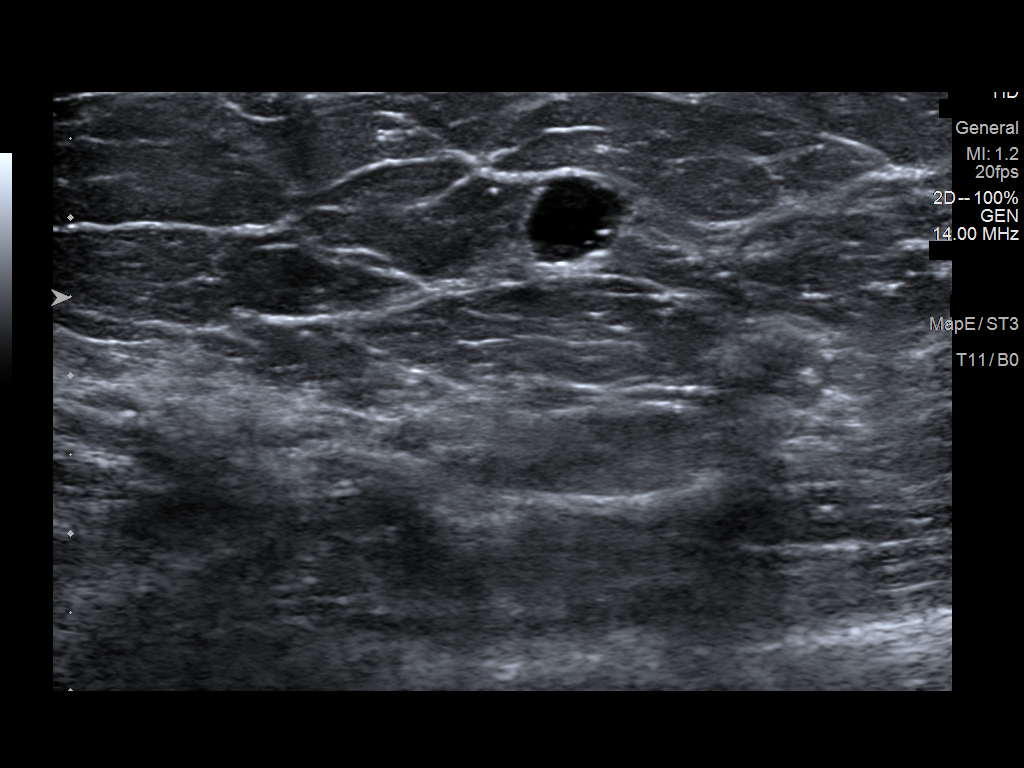
[im 2/6]
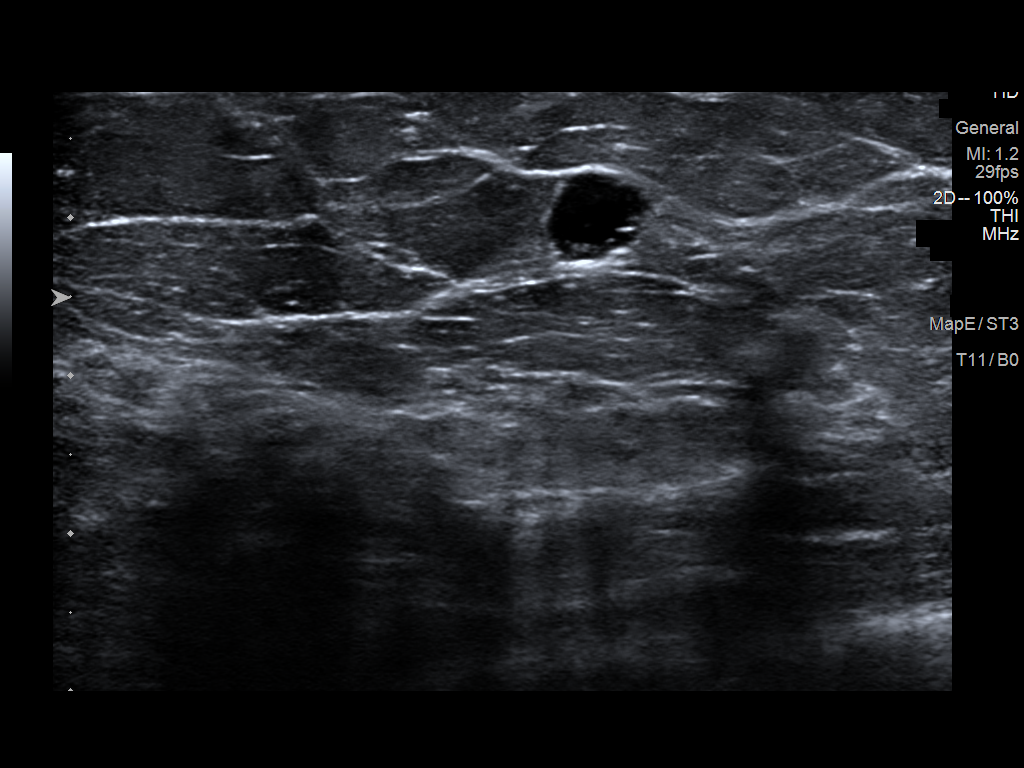
[im 3/6]
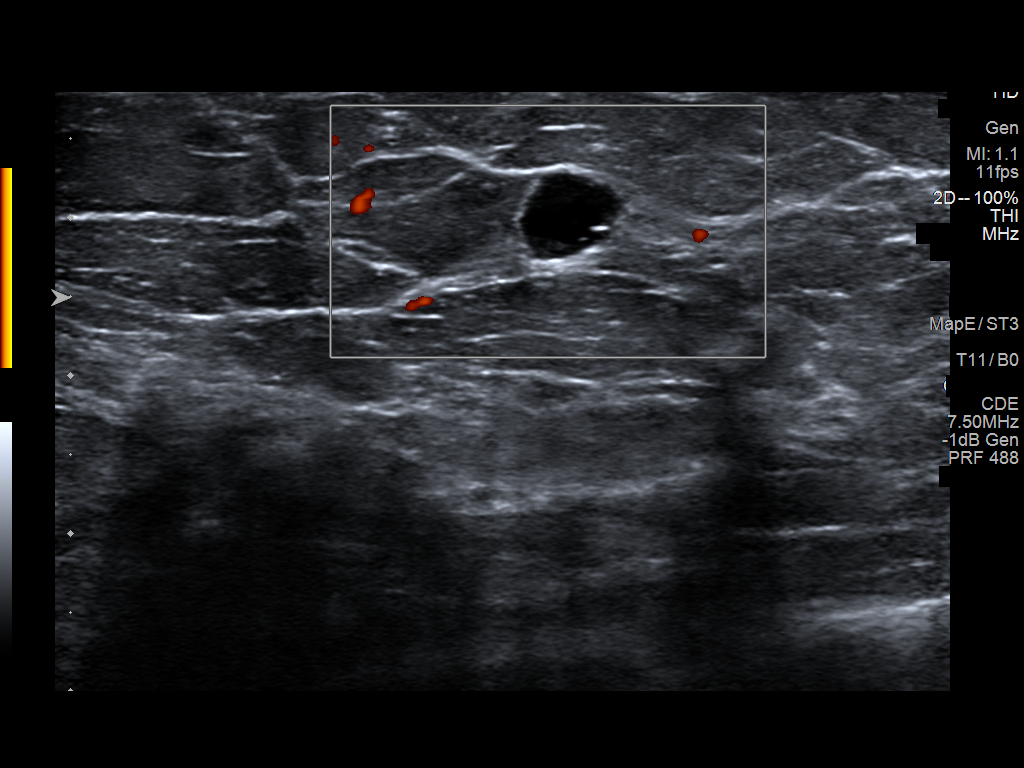
[im 4/6]
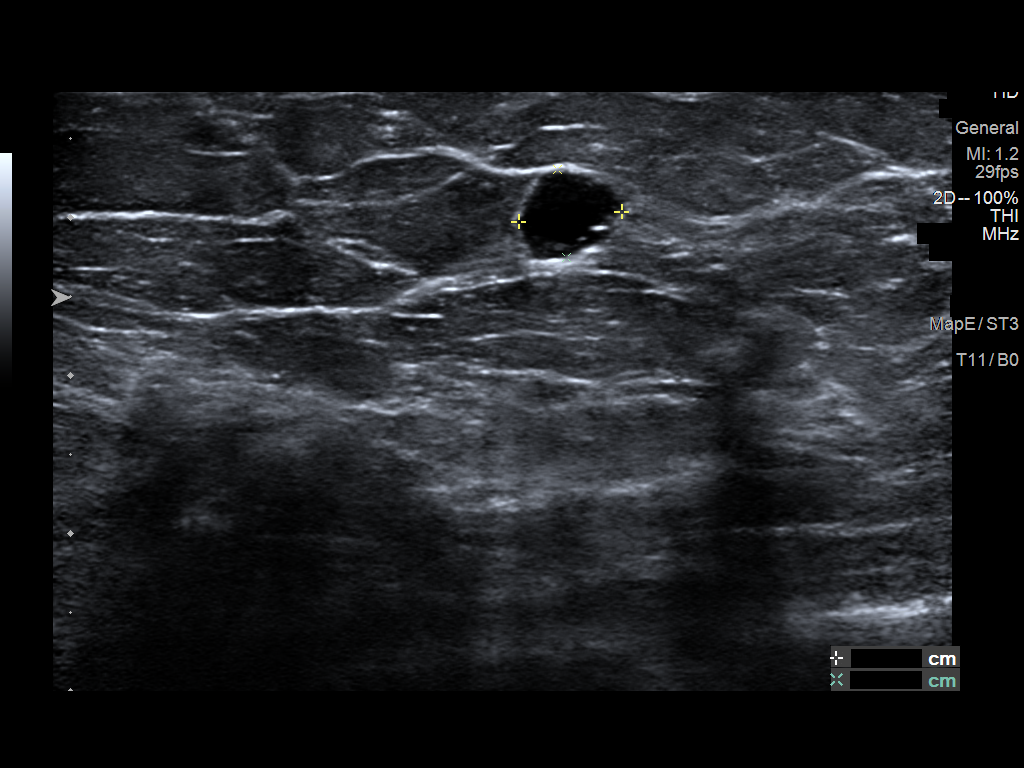
[im 5/6]
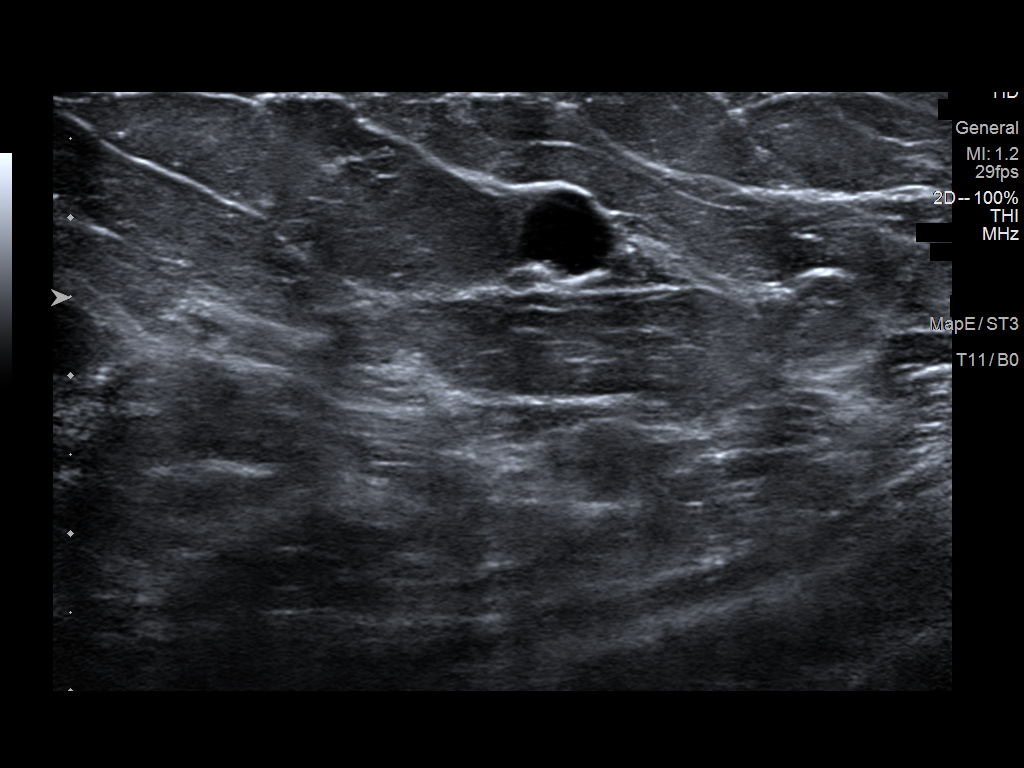
[im 6/6]
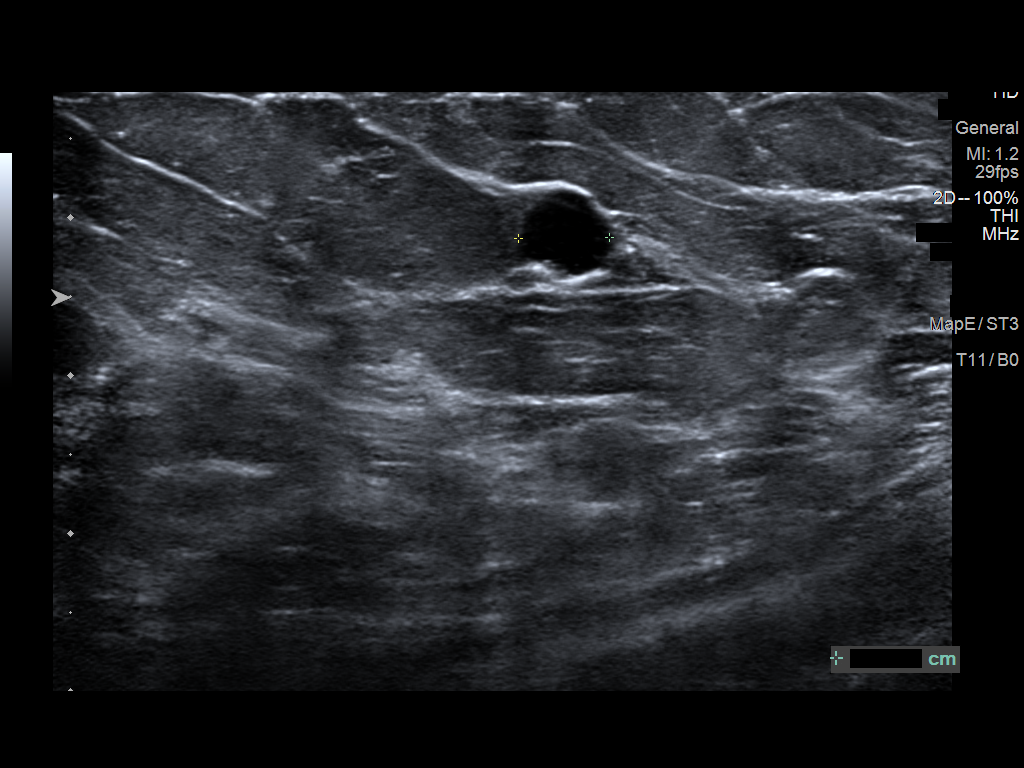

[6 of 6 positions shown; findings below may reference images not displayed]

FINDINGS: On physical exam, no suspicious lumps are identified.

Targeted ultrasound is performed, showing a mildly complicated cyst
at 6 o'clock in the right breast accounting for the mammographically
identified mass.
IMPRESSION: Fibrocystic changes.  No evidence of malignancy.

RECOMMENDATION:
Annual screening mammography.

I have discussed the findings and recommendations with the patient.
If applicable, a reminder letter will be sent to the patient
regarding the next appointment.

BI-RADS CATEGORY  2: Benign.

## 2023-10-03 ENCOUNTER — Ambulatory Visit
Admission: RE | Admit: 2023-10-03 | Discharge: 2023-10-03 | Disposition: A | Discharge status: Home / Self Care | Source: Ambulatory Visit | Attending: Obstetrics and Gynecology | Admitting: Obstetrics and Gynecology

## 2023-10-03 DIAGNOSIS — R928 Other abnormal and inconclusive findings on diagnostic imaging of breast: Secondary | ICD-10-CM | POA: Diagnosis not present

## 2023-10-03 DIAGNOSIS — R921 Mammographic calcification found on diagnostic imaging of breast: Secondary | ICD-10-CM

## 2023-11-17 DIAGNOSIS — E7800 Pure hypercholesterolemia, unspecified: Secondary | ICD-10-CM | POA: Diagnosis not present

## 2023-11-17 DIAGNOSIS — R739 Hyperglycemia, unspecified: Secondary | ICD-10-CM | POA: Diagnosis not present

## 2023-11-17 DIAGNOSIS — E559 Vitamin D deficiency, unspecified: Secondary | ICD-10-CM | POA: Diagnosis not present

## 2023-11-17 DIAGNOSIS — Z79899 Other long term (current) drug therapy: Secondary | ICD-10-CM | POA: Diagnosis not present

## 2023-11-19 DIAGNOSIS — R739 Hyperglycemia, unspecified: Secondary | ICD-10-CM | POA: Diagnosis not present

## 2023-11-19 DIAGNOSIS — M818 Other osteoporosis without current pathological fracture: Secondary | ICD-10-CM | POA: Diagnosis not present

## 2023-11-19 DIAGNOSIS — Z139 Encounter for screening, unspecified: Secondary | ICD-10-CM | POA: Diagnosis not present

## 2023-11-19 DIAGNOSIS — K1370 Unspecified lesions of oral mucosa: Secondary | ICD-10-CM | POA: Diagnosis not present

## 2023-11-19 DIAGNOSIS — E559 Vitamin D deficiency, unspecified: Secondary | ICD-10-CM | POA: Diagnosis not present

## 2023-11-19 DIAGNOSIS — E7800 Pure hypercholesterolemia, unspecified: Secondary | ICD-10-CM | POA: Diagnosis not present
